# Patient Record
Sex: Male | Born: 1990 | Race: White | Hispanic: No | Marital: Married | Smoking: Never smoker
Health system: Southern US, Community
[De-identification: ages and names within clinical notes are randomized; demographics above are authoritative.]

## PROBLEM LIST (undated history)

## (undated) DIAGNOSIS — G4730 Sleep apnea, unspecified: Secondary | ICD-10-CM

## (undated) DIAGNOSIS — E785 Hyperlipidemia, unspecified: Secondary | ICD-10-CM

## (undated) DIAGNOSIS — F419 Anxiety disorder, unspecified: Secondary | ICD-10-CM

## (undated) DIAGNOSIS — Z973 Presence of spectacles and contact lenses: Secondary | ICD-10-CM

## (undated) HISTORY — PX: ESOPHAGOGASTRODUODENOSCOPY: SHX1529

## (undated) HISTORY — PX: COLONOSCOPY: SHX174

---
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
## 2014-01-12 ENCOUNTER — Emergency Department: Payer: Self-pay | Admitting: Emergency Medicine

## 2014-01-12 ENCOUNTER — Ambulatory Visit: Payer: Self-pay | Admitting: Neurology

## 2014-01-12 LAB — CBC
HCT: 44.8 % (ref 40.0–52.0)
HGB: 14.9 g/dL (ref 13.0–18.0)
MCH: 28.4 pg (ref 26.0–34.0)
MCHC: 33.3 g/dL (ref 32.0–36.0)
MCV: 85 fL (ref 80–100)
Platelet: 203 10*3/uL (ref 150–440)
RBC: 5.25 10*6/uL (ref 4.40–5.90)
RDW: 12.6 % (ref 11.5–14.5)
WBC: 8.3 10*3/uL (ref 3.8–10.6)

## 2014-01-12 LAB — URINALYSIS, COMPLETE
BLOOD: NEGATIVE
Bacteria: NONE SEEN
Bilirubin,UR: NEGATIVE
GLUCOSE, UR: NEGATIVE mg/dL (ref 0–75)
Ketone: NEGATIVE
LEUKOCYTE ESTERASE: NEGATIVE
Nitrite: NEGATIVE
PH: 5 (ref 4.5–8.0)
Protein: NEGATIVE
RBC,UR: 1 /HPF (ref 0–5)
Specific Gravity: 1.018 (ref 1.003–1.030)
Squamous Epithelial: <1
WBC UR: 1 /HPF (ref 0–5)

## 2014-01-12 LAB — DRUG SCREEN, URINE
AMPHETAMINES, UR SCREEN: NEGATIVE (ref ?–1000)
BARBITURATES, UR SCREEN: NEGATIVE (ref ?–200)
BENZODIAZEPINE, UR SCRN: NEGATIVE (ref ?–200)
Cannabinoid 50 Ng, Ur ~~LOC~~: NEGATIVE (ref ?–50)
Cocaine Metabolite,Ur ~~LOC~~: NEGATIVE (ref ?–300)
MDMA (ECSTASY) UR SCREEN: NEGATIVE (ref ?–500)
Methadone, Ur Screen: NEGATIVE (ref ?–300)
OPIATE, UR SCREEN: NEGATIVE (ref ?–300)
Phencyclidine (PCP) Ur S: NEGATIVE (ref ?–25)
Tricyclic, Ur Screen: NEGATIVE (ref ?–1000)

## 2014-01-12 LAB — BASIC METABOLIC PANEL
Anion Gap: 6 — ABNORMAL LOW (ref 7–16)
BUN: 10 mg/dL (ref 7–18)
CALCIUM: 8.9 mg/dL (ref 8.5–10.1)
CHLORIDE: 105 mmol/L (ref 98–107)
CREATININE: 1.05 mg/dL (ref 0.60–1.30)
Co2: 29 mmol/L (ref 21–32)
EGFR (African American): >60
EGFR (Non-African Amer.): >60
Glucose: 98 mg/dL (ref 65–99)
OSMOLALITY: 278 (ref 275–301)
Potassium: 4 mmol/L (ref 3.5–5.1)
SODIUM: 140 mmol/L (ref 136–145)

## 2014-10-29 NOTE — Consult Note (Signed)
PATIENT NAME:  Perry Villanueva, Perry Villanueva MR#:  [id number] DATE OF BIRTH:  1991/01/15  DATE OF CONSULTATION:  01/12/2014  CONSULTING PHYSICIAN:  Pauletta BrownsYuriy Eliel Dudding, MD  REASON FOR CONSULTATION:  Headache.   HISTORY OF PRESENT ILLNESS: This is a pleasant 24 year old gentleman. No significant past medical history besides arachnoid cyst and history of  migraines, presents with a 2-3 day history of right temporal headache. The patient states he was eating, sitting with his girlfriend and then had sudden onset right temporal headache and that headache has spread diffuse, pressure-like 9/10 and worsened by position change.  The patient states he has chronic migraines.  Migraines are self relieved and completely different from these symptoms. No visual deficits at this point. 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    PAST MEDICAL HISTORY: No significant other past medical history.   FAMILY HISTORY: Noncontributory.   SOCIAL HISTORY:  No smoking or EtOH use.  REVIEW OF SYSTEMS:  No shortness of breath, generalized headache. No visual changes. No shortness of breath. No abdominal pain. No diarrhea or constipation. No focal weakness on one side of the body compared to the other.   PHYSICAL EXAMINATION: NEUROLOGICAL: Complains of generalized headache. No double vision. No blurred vision. Pupils equal, round, reactive. Facial sensation intact. Facial motor is intact. Motor 5/5 bilateral upper and lower extremities. Reflexes are intact throughout. Coordination: Finger-to-nose intact. Gait: Unable to assess because of generalized pain.  IMPRESSION:  A 24 year old male with history of left-sided arachnoid cyst, history of migraines 1-2 times a month, presents with sudden onset of right temporal headache that is not diffusely and radiates bilateral frontal lobe and worse with change of position.   PLAN: Since the  headache is sudden onset and not progressive, want to make sure we can rule out subarachnoid hemorrhage. Would recommend CT angiogram of head with contrast to make sure there are no signs of aneurysms, 2 grams of magnesium, Phenergan and evaluate the patient further. If the patient continues to have the headache, will consider lumbar puncture to look for xanthochromia making sure there is no subarachnoid hemorrhage.  Thank you.  It was a pleasure seeing this patient. Please call if questions.  ____________________________ Pauletta BrownsYuriy Kendan Cornforth, MD yz:ds D: 01/12/2014 15:01:27 ET T: 01/12/2014 19:31:08 ET JOB#: [id number]  cc: Pauletta BrownsYuriy Jodelle Fausto, MD, <Dictator> Pauletta BrownsYURIY Emigdio Wildeman MD ELECTRONICALLY SIGNED 02/08/2014 21:10

## 2020-02-21 ENCOUNTER — Emergency Department
Admission: EM | Admit: 2020-02-21 | Discharge: 2020-02-21 | Disposition: A | Discharge status: Home / Self Care | Payer: BC Managed Care – PPO | Attending: Emergency Medicine | Admitting: Emergency Medicine

## 2020-02-21 ENCOUNTER — Emergency Department: Payer: BC Managed Care – PPO

## 2020-02-21 ENCOUNTER — Encounter: Payer: Self-pay | Admitting: Emergency Medicine

## 2020-02-21 ENCOUNTER — Other Ambulatory Visit: Payer: Self-pay

## 2020-02-21 DIAGNOSIS — Z79899 Other long term (current) drug therapy: Secondary | ICD-10-CM | POA: Insufficient documentation

## 2020-02-21 DIAGNOSIS — M545 Low back pain, unspecified: Secondary | ICD-10-CM

## 2020-02-21 MED ORDER — OXYCODONE-ACETAMINOPHEN 5-325 MG PO TABS
1.0000 | ORAL_TABLET | Freq: Once | ORAL | Status: AC
Start: 2020-02-21 — End: 2020-02-21
  Administered 2020-02-21: 1 via ORAL
  Filled 2020-02-21: qty 1

## 2020-02-21 MED ORDER — TRAMADOL HCL 50 MG PO TABS: 50.0000 mg | ORAL_TABLET | Freq: Four times a day (QID) | ORAL | 0 refills | Status: AC | PRN

## 2020-02-21 MED ORDER — IBUPROFEN 400 MG PO TABS
400.0000 mg | ORAL_TABLET | Freq: Four times a day (QID) | ORAL | 0 refills | Status: DC | PRN
Start: 2020-02-21 — End: 2022-06-20

## 2020-02-21 MED ORDER — LIDOCAINE 5 % EX PTCH
1.0000 | MEDICATED_PATCH | CUTANEOUS | Status: DC
  Administered 2020-02-21: 1 via TRANSDERMAL
  Filled 2020-02-21: qty 1

## 2020-02-21 MED ORDER — CYCLOBENZAPRINE HCL 5 MG PO TABS
ORAL_TABLET | ORAL | 0 refills | Status: DC
Start: 2020-02-21 — End: 2022-06-20

## 2020-02-21 MED ORDER — PREDNISONE 20 MG PO TABS
60.0000 mg | ORAL_TABLET | Freq: Once | ORAL | Status: AC
  Administered 2020-02-21: 60 mg via ORAL
  Filled 2020-02-21: qty 3

## 2020-02-21 MED ORDER — LIDOCAINE 5 % EX PTCH: 1.0000 | MEDICATED_PATCH | CUTANEOUS | 0 refills | Status: DC

## 2020-02-21 MED ORDER — KETOROLAC TROMETHAMINE 30 MG/ML IJ SOLN
30.0000 mg | Freq: Once | INTRAMUSCULAR | Status: AC
  Administered 2020-02-21: 30 mg via INTRAMUSCULAR
  Filled 2020-02-21: qty 1

## 2020-02-21 MED ORDER — PREDNISONE 10 MG PO TABS
ORAL_TABLET | ORAL | 0 refills | Status: DC
Start: 2020-02-21 — End: 2022-06-20

## 2020-02-21 MED ORDER — ORPHENADRINE CITRATE 30 MG/ML IJ SOLN
60.0000 mg | Freq: Two times a day (BID) | INTRAMUSCULAR | Status: DC
  Administered 2020-02-21: 60 mg via INTRAMUSCULAR
  Filled 2020-02-21: qty 2

## 2020-02-21 NOTE — ED Provider Notes (Signed)
[facility] Emergency Department Provider Note  ____________________________________________  Time seen: Approximately 7:19 PM  I have reviewed the triage vital signs and the nursing notes.   HISTORY  Chief Complaint Back Pain    HPI Perry Villanueva is a 29 y.o. male that presents to the emergency department for evaluation of low back pain since yesterday.  Patient had just gotten out of the shower, when he felt a pop to his low back.  He took some ibuprofen and went to bed.  This morning, he still had pain throughout his low back.  It did not improve throughout the day and he had difficulty walking due to the pain.  Pain is in his mid low back and does not radiate.  No bowel or bladder dysfunction or saddle anesthesias. No history of back pain.  No numbness, tingling.  History reviewed. No pertinent past medical history.  There are no problems to display for this patient.    Prior to Admission medications   Medication Sig Start Date End Date Taking? Authorizing Provider  escitalopram (LEXAPRO) 20 MG tablet Take 20 mg by mouth daily.   Yes [provider]  cyclobenzaprine (FLEXERIL) 5 MG tablet Take 1-2 tablets 3 times daily as needed 02/21/20   Enid Derry, PA-C  ibuprofen (ADVIL) 400 MG tablet Take 1 tablet (400 mg total) by mouth every 6 (six) hours as needed. 02/21/20   Enid Derry, PA-C  lidocaine (LIDODERM) 5 % Place 1 patch onto the skin daily. Remove & Discard patch within 12 hours or as directed by MD 02/21/20   Enid Derry, PA-C  predniSONE (DELTASONE) 10 MG tablet Take 6 tablets on day 1, take 5 tablets on day 2, take 4 tablets on day 3, take 3 tablets on day 4, take 2 tablets on day 5, take 1 tablet on day 6 02/21/20   Enid Derry, PA-C  traMADol (ULTRAM) 50 MG tablet Take 1 tablet (50 mg total) by mouth every 6 (six) hours as needed. 02/21/20 02/20/21  Enid Derry, PA-C    Allergies Shellfish allergy  No family history on  file.  Social History Social History   Tobacco Use  . Smoking status: Not on file  Substance Use Topics  . Alcohol use: Not on file  . Drug use: Not on file     Review of Systems  Constitutional: No fever/chills Cardiovascular: No chest pain. Respiratory: No SOB. Gastrointestinal: No abdominal pain.  No nausea, no vomiting.  Musculoskeletal: Positive for low back pain. Skin: Negative for rash, abrasions, lacerations, ecchymosis. Neurological: Negative for headaches, numbness or tingling   ____________________________________________   PHYSICAL EXAM:  VITAL SIGNS: ED Triage Vitals  Enc Vitals Group     BP 02/21/20 1843 (!) 148/88     Pulse Rate 02/21/20 1843 88     Resp 02/21/20 1843 18     Temp 02/21/20 1843 98 F (36.7 C)     Temp Source 02/21/20 1843 Oral     SpO2 02/21/20 1843 98 %     Weight 02/21/20 1838 225 lb (102.1 kg)     Height 02/21/20 1838 6' (1.829 m)     Head Circumference --      Peak Flow --      Pain Score 02/21/20 1838 9     Pain Loc --      Pain Edu? --      Excl. in GC? --      Constitutional: Alert and oriented. Well appearing and in  no acute distress. Eyes: Conjunctivae are normal. PERRL. EOMI. Head: Atraumatic. ENT:      Ears:      Nose: No congestion/rhinnorhea.      Mouth/Throat: Mucous membranes are moist.  Neck: No stridor.  Cardiovascular: Normal rate, regular rhythm.  Good peripheral circulation. Respiratory: Normal respiratory effort without tachypnea or retractions. Lungs CTAB. Good air entry to the bases with no decreased or absent breath sounds. Gastrointestinal: Bowel sounds 4 quadrants. Soft and nontender to palpation. No guarding or rigidity. No palpable masses. No distention. Musculoskeletal: Full range of motion to all extremities. No gross deformities appreciated. Mild tenderness to palpation to lumbar spine. Strength equal in lower extremities bilaterally. Neurologic:  Normal speech and language. No gross focal  neurologic deficits are appreciated.  Skin:  Skin is warm, dry and intact. No rash noted. Psychiatric: Mood and affect are normal. Speech and behavior are normal. Patient exhibits appropriate insight and judgement.   ____________________________________________   LABS (all labs ordered are listed, but only abnormal results are displayed)  Labs Reviewed - No data to display ____________________________________________  EKG   ____________________________________________  RADIOLOGY Lexine Baton, personally viewed and evaluated these images (plain radiographs) as part of my medical decision making, as well as reviewing the written report by the radiologist.  DG Lumbar Spine 2-3 Views  Result Date: 02/21/2020 CLINICAL DATA:  Lumbosacral back pain. Felt a pop getting out of the shower. EXAM: LUMBAR SPINE - 2-3 VIEW COMPARISON:  None. FINDINGS: Mild straightening of normal lordosis. Vertebral body heights are normal. There is no listhesis. The posterior elements are intact. Disc spaces are preserved. No fracture. No evidence of pars defects or focal lesion. Sacroiliac joints are symmetric and normal. IMPRESSION: Mild straightening of normal lordosis, can be seen with muscle spasm or positioning. No acute osseous abnormality. Electronically Signed   By: Narda Rutherford M.D.   On: 02/21/2020 19:51    ____________________________________________    PROCEDURES  Procedure(s) performed:    Procedures    Medications  orphenadrine (NORFLEX) injection 60 mg (60 mg Intramuscular Given 02/21/20 1929)  lidocaine (LIDODERM) 5 % 1 patch (1 patch Transdermal Patch Applied 02/21/20 1929)  oxyCODONE-acetaminophen (PERCOCET/ROXICET) 5-325 MG per tablet 1 tablet (1 tablet Oral Given 02/21/20 1929)  ketorolac (TORADOL) 30 MG/ML injection 30 mg (30 mg Intramuscular Given 02/21/20 2125)  predniSONE (DELTASONE) tablet 60 mg (60 mg Oral Given 02/21/20 2214)      ____________________________________________   INITIAL IMPRESSION / ASSESSMENT AND PLAN / ED COURSE  Pertinent labs & imaging results that were available during my care of the patient were reviewed by me and considered in my medical decision making (see chart for details).  Review of the [place] CSRS was performed in accordance of the [facility] prior to dispensing any controlled drugs.    Patient presented to the emergency department for evaluation of low back pain.  Vital signs and exam are reassuring.  No acute bony abnormality on x-ray.  Pain improved following Percocet, Norflex, Toradol.  Patient is now up ambulating.  Patient will be discharged home with prescriptions for Flexeril, Motrin, prednisone, Lidoderm, short course of tramadol. Patient is to follow up with orthopedics as directed.  Referral was given.  Patient is given ED precautions to return to the ED for any worsening or new symptoms.   Perry Villanueva was evaluated in Emergency Department on 02/21/2020 for the symptoms described in the history of present illness. He was evaluated in the context of the global COVID-19 pandemic, which  necessitated consideration that the patient might be at risk for infection with the SARS-CoV-2 virus that causes COVID-19. Institutional protocols and algorithms that pertain to the evaluation of patients at risk for COVID-19 are in a state of rapid change based on information released by regulatory bodies including the CDC and federal and state organizations. These policies and algorithms were followed during the patient's care in the ED.  ____________________________________________  FINAL CLINICAL IMPRESSION(S) / ED DIAGNOSES  Final diagnoses:  Acute midline low back pain without sciatica      NEW MEDICATIONS STARTED DURING THIS VISIT:  ED Discharge Orders         Ordered    predniSONE (DELTASONE) 10 MG tablet     Discontinue  Reprint     02/21/20 2208    cyclobenzaprine (FLEXERIL) 5 MG  tablet     Discontinue  Reprint     02/21/20 2208    lidocaine (LIDODERM) 5 %  Every 24 hours     Discontinue  Reprint     02/21/20 2208    traMADol (ULTRAM) 50 MG tablet  Every 6 hours PRN     Discontinue  Reprint     02/21/20 2208    ibuprofen (ADVIL) 400 MG tablet  Every 6 hours PRN     Discontinue  Reprint     02/21/20 2208              This chart was dictated using voice recognition software/Dragon. Despite best efforts to proofread, errors can occur which can change the meaning. Any change was purely unintentional.    Enid Derry, PA-C 02/21/20 2221    Minna Antis, MD 02/21/20 [phone]

## 2020-02-21 NOTE — ED Notes (Signed)
Pt up to restroom. Will wheel pt out to vehicle once ready.

## 2020-02-21 NOTE — ED Triage Notes (Signed)
Presents with lower back pain  States he felt his back "pop" last pm when he was getting out of shower   Unable to bear full wt  States he used OTC meds w/o relief

## 2020-02-25 ENCOUNTER — Other Ambulatory Visit: Payer: Self-pay | Admitting: Family Medicine

## 2020-02-25 ENCOUNTER — Ambulatory Visit
Admission: RE | Admit: 2020-02-25 | Discharge: 2020-02-25 | Disposition: A | Discharge status: Home / Self Care | Payer: BC Managed Care – PPO | Source: Ambulatory Visit | Attending: Family Medicine | Admitting: Family Medicine

## 2020-02-25 ENCOUNTER — Other Ambulatory Visit: Payer: Self-pay

## 2020-02-25 DIAGNOSIS — M545 Low back pain, unspecified: Secondary | ICD-10-CM

## 2020-07-28 ENCOUNTER — Other Ambulatory Visit: Payer: Self-pay

## 2020-07-28 ENCOUNTER — Ambulatory Visit: Payer: BC Managed Care – PPO | Attending: Nurse Practitioner | Admitting: Physical Therapy

## 2020-07-28 ENCOUNTER — Encounter: Payer: Self-pay | Admitting: Physical Therapy

## 2020-07-28 DIAGNOSIS — G8929 Other chronic pain: Secondary | ICD-10-CM | POA: Diagnosis present

## 2020-07-28 DIAGNOSIS — M6281 Muscle weakness (generalized): Secondary | ICD-10-CM | POA: Diagnosis present

## 2020-07-28 DIAGNOSIS — M545 Low back pain, unspecified: Secondary | ICD-10-CM | POA: Insufficient documentation

## 2020-08-01 NOTE — Therapy (Signed)
[facility] [address]. [place], [place], [address] Phone: [phone]   Fax:  [phone]  Physical Therapy Evaluation  Patient Details  Name: Perry Villanueva MRN: [id number] Date of Birth: 1991/02/20 Referring Provider (PT): Adrian Prows, NP   Encounter Date: 07/28/2020  Treatment: 1 of 1.  Recert date: 07/29/2020 8676 to 1950    [facility] PT Assessment - 08/01/20 0001      Assessment   Medical Diagnosis Lumbar radiculopathy    Referring Provider (PT) Adrian Prows, NP    Onset Date/Surgical Date 02/20/20      Home Environment   Living Environment Private residence      Prior Function   Level of Independence Independent            See FCE report  Pt. Reports increase low back pain on 02/20/20 when stepping out of shower (rotation).  Pt. States the movement resulted in a herniated disc at L5-S1.  Pt. Had a series of cortisone injections with slight improvement.  Pt. Reports difficulty with sleeping due to low back pain.  Pt. C/o 1/10 low back pain currently at rest and 10/10 at worst (R LE "gave out").  Pt. Doing online school and has been out of work at [employer] due to low back issues.               Plan - 08/01/20 0808    Clinical Impression Statement Overall Level of Work: Falls within the Medium range.  Exerting 20 to 50 pounds of force occasionally, and/or 10 to 25 pounds of force frequently, and/or greater than negligible up to 10 pounds of force constantly to move objects.  Physical Demand requirements are in excess of those for Light Work.    Please see the Task Performance Table for specific abilities.  Tolerance for the 8-Hour Day: Based on the individual task scores in Dynamic Strength, Position Tolerance and Mobility, the client is able to tolerate the Medium level of work for the 8-hour day/40-hour week.    Stability/Clinical Decision Making Stable/Uncomplicated    Clinical Decision Making Low    Rehab Potential  Good    PT Frequency One time visit    PT Treatment/Interventions ADLs/Self Care Home Management;Functional mobility training;Gait training;Stair training;Therapeutic exercise;Balance training;Neuromuscular re-education;Patient/family education;Therapeutic activities    PT Next Visit Plan FCE only.  Report faxed to MD office           Patient will benefit from skilled therapeutic intervention in order to improve the following deficits and impairments:  Pain,Postural dysfunction,Decreased strength,Obesity,Decreased range of motion,Decreased activity tolerance,Decreased endurance,Decreased mobility  Visit Diagnosis: Chronic bilateral low back pain, unspecified whether sciatica present  Muscle weakness (generalized)     Problem List There are no problems to display for this patient.  Cammie Mcgee, PT, DPT # [phone] 08/01/2020, 8:15 AM   [facility] [address] [place], [place], [address] Phone: [phone]   Fax:  [phone]  Name: Carlitos Bottino MRN: [id number] Date of Birth: [birth year]

## 2020-10-10 ENCOUNTER — Other Ambulatory Visit: Payer: Self-pay | Admitting: Family Medicine

## 2020-10-10 ENCOUNTER — Ambulatory Visit
Admission: RE | Admit: 2020-10-10 | Discharge: 2020-10-10 | Disposition: A | Discharge status: Home / Self Care | Payer: BC Managed Care – PPO | Source: Ambulatory Visit | Attending: Family Medicine | Admitting: Family Medicine

## 2020-10-10 ENCOUNTER — Other Ambulatory Visit: Payer: Self-pay

## 2020-10-10 DIAGNOSIS — M5416 Radiculopathy, lumbar region: Secondary | ICD-10-CM | POA: Diagnosis present

## 2022-04-11 ENCOUNTER — Other Ambulatory Visit: Payer: Self-pay | Admitting: Podiatry

## 2022-04-11 DIAGNOSIS — M722 Plantar fascial fibromatosis: Secondary | ICD-10-CM

## 2022-04-16 ENCOUNTER — Ambulatory Visit
Admission: RE | Admit: 2022-04-16 | Discharge: 2022-04-16 | Disposition: A | Discharge status: Home / Self Care | Payer: BC Managed Care – PPO | Source: Ambulatory Visit | Attending: Podiatry | Admitting: Podiatry

## 2022-04-16 DIAGNOSIS — M722 Plantar fascial fibromatosis: Secondary | ICD-10-CM | POA: Insufficient documentation

## 2022-06-03 ENCOUNTER — Other Ambulatory Visit: Payer: Self-pay | Admitting: Podiatry

## 2022-06-20 ENCOUNTER — Encounter: Payer: Self-pay | Admitting: Podiatry

## 2022-06-20 NOTE — Discharge Instructions (Addendum)
[facility]  POST OPERATIVE INSTRUCTIONS FOR DR. Vickki Muff AND DR. Bainbridge   Take your medication as prescribed.  Pain medication should be taken only as needed.  May take ibuprofen/tylenol in addition to pain medication as needed.  If pain is still severe try taking ibuprofen/tylenol between pain med doses.  If still severe you may take 1 pain tablet every 4 hours, if still severe you may take 2 pain tablets every 6 hours.  If still severe, contact physician office for instruction.  Pain is a normal response to the healing process and expected during the postop healing time.  Keep the dressing clean, dry and intact.  Maintain nonweightbearing to the left foot at all times.  Try to keep the boot on for the most part at all times other than hygiene/changing clothes.  The boot is acting as a cast/splint, please sleep in the boot with the boot in a slightly elevated position to improve swelling.  Keep your foot elevated above the heart level for the first 48 hours, continue thereafter to improve swelling.  Walking to the bathroom and brief periods of walking are acceptable, unless we have instructed you to be non-weight bearing.  Always wear your post-op shoe when walking.  Always use your crutches if you are to be non-weight bearing.  Do not take a shower. Baths are permissible as long as the foot is kept out of the water.   Every hour you are awake:  Bend your knee 15 times. Massage calf 15 times  Call [facility] [phone]) if any of the following problems occur: You develop a temperature or fever. The bandage becomes saturated with blood. Medication does not stop your pain. Injury of the foot occurs. Any symptoms of infection including redness, odor, or red streaks running from wound.  Information for Discharge Teaching: EXPAREL (bupivacaine liposome injectable suspension)   Your surgeon or  anesthesiologist gave you EXPAREL(bupivacaine) to help control your pain after surgery.  EXPAREL is a local anesthetic that provides pain relief by numbing the tissue around the surgical site. EXPAREL is designed to release pain medication over time and can control pain for up to 72 hours. Depending on how you respond to EXPAREL, you may require less pain medication during your recovery.  Possible side effects: Temporary loss of sensation or ability to move in the area where bupivacaine was injected. Nausea, vomiting, constipation Rarely, numbness and tingling in your mouth or lips, lightheadedness, or anxiety may occur. Call your doctor right away if you think you may be experiencing any of these sensations, or if you have other questions regarding possible side effects.  Follow all other discharge instructions given to you by your surgeon or nurse. Eat a healthy diet and drink plenty of water or other fluids.  If you return to the hospital for any reason within 96 hours following the administration of EXPAREL, it is important for health care providers to know that you have received this anesthetic. A teal colored band has been placed on your arm with the date, time and amount of EXPAREL you have received in order to alert and inform your health care providers. Please leave this armband in place for the full 96 hours following administration, and then you may remove the band.  PERIPHERAL NERVE BLOCK PATIENT INFORMATION  Your surgeon has requested a peripheral nerve block for your surgery. This anesthetic technique provides excellent post-operative pain relief for you in a safe and  effective manner. It will also help reduce the risk of nausea and vomiting and allow earlier discharge from the hospital.   The block is performed under sedation with ultrasound guidance prior to your procedure. Due to the sedation, your may or may not remember the block experience. The nerve block will begin to take  effect anywhere from 5 to 30 minutes after being administered. You will be transported to the operating room from your surgery after the block is completed.   At the end of surgery, when the anesthesia wears off, you will notice a few things. Your may not be able to move or feel the part of your body targeted by the nerve block. These are normal experiences, and they will disappear as the block wears off.  If you had an interscalene nerve block performed (which is common for shoulder surgery), your voice can be very hoarse and you may feel that you are not able to take as deep a breath as you did before surgery. Some patients may also notice a droopy eyelid on the affected side. These symptoms will resolve once the block wears off.  Pain control: The nerve block technique used is a single injection that can last anywhere from 1-3 days. The duration of the numbness can vary between individuals. After leaving the hospital, it is important that you begin to take your prescribed pain medication when you start to sense the nerve block wearing off. This will help you avoid unpleasant pain at the time the nerve block wears off, which can sometimes be in the middle of the night. The block will only cover pain in the areas targeted by the nerve block so if you experience surgical pain outside of that area, please take your prescribed pain medication. Management of the "numb area": After a nerve block, you cannot feel pain, pressure, or temperature in the affected area so there is an increased risk for injury. You should take extra care to protect the affected areas until sensation and movement returns. Please take caution to not come in contact with extremely hot or cold items because you will not be able to sense or protect yourself form the extremes of temperature.  You may experience some persistent numbness after the procedure by most neurological deficits resolve over time and the incidence of serious long term  neurological complications attributable to peripheral nerve blocks are relatively uncommon.

## 2022-06-27 ENCOUNTER — Ambulatory Visit: Payer: BC Managed Care – PPO | Admitting: Anesthesiology

## 2022-06-27 ENCOUNTER — Encounter
Admission: RE | Disposition: A | Discharge status: Home / Self Care | Payer: Self-pay | Source: Home / Self Care | Attending: Podiatry

## 2022-06-27 ENCOUNTER — Ambulatory Visit
Admission: RE | Admit: 2022-06-27 | Discharge: 2022-06-27 | Disposition: A | Discharge status: Home / Self Care | Payer: BC Managed Care – PPO | Attending: Podiatry | Admitting: Podiatry

## 2022-06-27 ENCOUNTER — Encounter: Payer: Self-pay | Admitting: Podiatry

## 2022-06-27 ENCOUNTER — Ambulatory Visit: Payer: Self-pay

## 2022-06-27 ENCOUNTER — Other Ambulatory Visit: Payer: Self-pay

## 2022-06-27 DIAGNOSIS — M62462 Contracture of muscle, left lower leg: Secondary | ICD-10-CM | POA: Insufficient documentation

## 2022-06-27 DIAGNOSIS — F419 Anxiety disorder, unspecified: Secondary | ICD-10-CM | POA: Insufficient documentation

## 2022-06-27 DIAGNOSIS — M722 Plantar fascial fibromatosis: Secondary | ICD-10-CM | POA: Insufficient documentation

## 2022-06-27 DIAGNOSIS — M7732 Calcaneal spur, left foot: Secondary | ICD-10-CM | POA: Insufficient documentation

## 2022-06-27 DIAGNOSIS — G4730 Sleep apnea, unspecified: Secondary | ICD-10-CM | POA: Diagnosis not present

## 2022-06-27 HISTORY — DX: Anxiety disorder, unspecified: F41.9

## 2022-06-27 HISTORY — DX: Sleep apnea, unspecified: G47.30

## 2022-06-27 HISTORY — PX: PLANTAR FASCIA RELEASE: SHX2239

## 2022-06-27 HISTORY — PX: EXCISION PARTIAL PHALANX: SHX6617

## 2022-06-27 HISTORY — PX: GASTROC RECESSION EXTREMITY: SHX6262

## 2022-06-27 HISTORY — DX: Presence of spectacles and contact lenses: Z97.3

## 2022-06-27 HISTORY — DX: Hyperlipidemia, unspecified: E78.5

## 2022-06-27 SURGERY — RECESSION, TENDON, GASTROCNEMIUS
Anesthesia: General | Laterality: Left

## 2022-06-27 MED ORDER — ONDANSETRON HCL 4 MG PO TABS: 4.0000 mg | ORAL_TABLET | Freq: Three times a day (TID) | ORAL | 0 refills | Status: AC | PRN

## 2022-06-27 MED ORDER — ROCURONIUM BROMIDE 100 MG/10ML IV SOLN
INTRAVENOUS | Status: DC | PRN
  Administered 2022-06-27: 30 mg via INTRAVENOUS
  Administered 2022-06-27: 20 mg via INTRAVENOUS

## 2022-06-27 MED ORDER — FENTANYL CITRATE (PF) 100 MCG/2ML IJ SOLN
INTRAMUSCULAR | Status: DC | PRN
  Administered 2022-06-27 (×3): 50 ug via INTRAVENOUS
  Administered 2022-06-27: 100 ug via INTRAVENOUS

## 2022-06-27 MED ORDER — BUPIVACAINE LIPOSOME 1.3 % IJ SUSP
INTRAMUSCULAR | Status: DC | PRN
  Administered 2022-06-27: 10 mL

## 2022-06-27 MED ORDER — SUGAMMADEX SODIUM 200 MG/2ML IV SOLN
INTRAVENOUS | Status: DC | PRN
  Administered 2022-06-27: 600 mg via INTRAVENOUS

## 2022-06-27 MED ORDER — 0.9 % SODIUM CHLORIDE (POUR BTL) OPTIME
TOPICAL | Status: DC | PRN
  Administered 2022-06-27: 1000 mL

## 2022-06-27 MED ORDER — ASPIRIN 81 MG PO TBEC: 81.0000 mg | DELAYED_RELEASE_TABLET | Freq: Two times a day (BID) | ORAL | 0 refills | Status: AC

## 2022-06-27 MED ORDER — FENTANYL CITRATE PF 50 MCG/ML IJ SOSY: 25.0000 ug | PREFILLED_SYRINGE | INTRAMUSCULAR | Status: DC | PRN

## 2022-06-27 MED ORDER — OXYCODONE-ACETAMINOPHEN 5-325 MG PO TABS: 1.0000 | ORAL_TABLET | Freq: Four times a day (QID) | ORAL | 0 refills | Status: AC | PRN

## 2022-06-27 MED ORDER — PROPOFOL 10 MG/ML IV BOLUS
INTRAVENOUS | Status: DC | PRN
  Administered 2022-06-27: 200 mg via INTRAVENOUS
  Administered 2022-06-27: 100 ug/kg/min via INTRAVENOUS

## 2022-06-27 MED ORDER — OXYCODONE HCL 5 MG PO TABS
5.0000 mg | ORAL_TABLET | Freq: Once | ORAL | Status: AC | PRN
  Administered 2022-06-27: 5 mg via ORAL

## 2022-06-27 MED ORDER — MIDAZOLAM HCL 5 MG/5ML IJ SOLN
INTRAMUSCULAR | Status: DC | PRN
  Administered 2022-06-27: 1 mg via INTRAVENOUS

## 2022-06-27 MED ORDER — ONDANSETRON HCL 4 MG/2ML IJ SOLN
INTRAMUSCULAR | Status: DC | PRN
  Administered 2022-06-27: 4 mg via INTRAVENOUS

## 2022-06-27 MED ORDER — ACETAMINOPHEN 10 MG/ML IV SOLN
INTRAVENOUS | Status: DC | PRN
  Administered 2022-06-27: 1000 mg via INTRAVENOUS

## 2022-06-27 MED ORDER — SUGAMMADEX SODIUM 200 MG/2ML IV SOLN: INTRAVENOUS | Status: DC | PRN

## 2022-06-27 MED ORDER — OXYCODONE HCL 5 MG/5ML PO SOLN: 5.0000 mg | Freq: Once | ORAL | Status: AC | PRN

## 2022-06-27 MED ORDER — CEFAZOLIN SODIUM-DEXTROSE 2-4 GM/100ML-% IV SOLN
2.0000 g | INTRAVENOUS | Status: AC
  Administered 2022-06-27: 2 g via INTRAVENOUS

## 2022-06-27 MED ORDER — LIDOCAINE HCL (CARDIAC) PF 100 MG/5ML IV SOSY
PREFILLED_SYRINGE | INTRAVENOUS | Status: DC | PRN
  Administered 2022-06-27: 100 mg via INTRATRACHEAL

## 2022-06-27 MED ORDER — DEXMEDETOMIDINE HCL IN NACL 80 MCG/20ML IV SOLN
INTRAVENOUS | Status: DC | PRN
  Administered 2022-06-27 (×2): 6 ug via BUCCAL

## 2022-06-27 MED ORDER — BUPIVACAINE HCL (PF) 0.5 % IJ SOLN
INTRAMUSCULAR | Status: DC | PRN
  Administered 2022-06-27: 10 mL via PERINEURAL

## 2022-06-27 MED ORDER — BUPIVACAINE LIPOSOME 1.3 % IJ SUSP
INTRAMUSCULAR | Status: DC | PRN
  Administered 2022-06-27: 10 mL via PERINEURAL

## 2022-06-27 MED ORDER — LACTATED RINGERS IV SOLN: INTRAVENOUS | Status: DC

## 2022-06-27 MED ORDER — SUCCINYLCHOLINE CHLORIDE 200 MG/10ML IV SOSY
PREFILLED_SYRINGE | INTRAVENOUS | Status: DC | PRN
  Administered 2022-06-27: 200 mg via INTRAVENOUS

## 2022-06-27 MED ORDER — DEXAMETHASONE SODIUM PHOSPHATE 4 MG/ML IJ SOLN
INTRAMUSCULAR | Status: DC | PRN
  Administered 2022-06-27: 8 mg via INTRAVENOUS

## 2022-06-27 SURGICAL SUPPLY — 42 items
BNDG CMPR 5X4 CHSV STRCH STRL (GAUZE/BANDAGES/DRESSINGS) ×1
BNDG CMPR STD VLCR NS LF 5.8X4 (GAUZE/BANDAGES/DRESSINGS) ×2
BNDG COHESIVE 4X5 TAN STRL LF (GAUZE/BANDAGES/DRESSINGS) ×1 IMPLANT
BNDG ELASTIC 4X5.8 VLCR NS LF (GAUZE/BANDAGES/DRESSINGS) ×2 IMPLANT
BNDG ESMARK 4X12 TAN STRL LF (GAUZE/BANDAGES/DRESSINGS) ×1 IMPLANT
BNDG GAUZE DERMACEA FLUFF 4 (GAUZE/BANDAGES/DRESSINGS) ×1 IMPLANT
BNDG GZE DERMACEA 4 6PLY (GAUZE/BANDAGES/DRESSINGS) ×1
CANISTER SUCT 1200ML W/VALVE (MISCELLANEOUS) ×1 IMPLANT
COVER LIGHT HANDLE UNIVERSAL (MISCELLANEOUS) ×2 IMPLANT
CUFF TOURN SGL QUICK 18X4 (TOURNIQUET CUFF) IMPLANT
CUFF TOURN SGL QUICK 24 (TOURNIQUET CUFF)
CUFF TRNQT CYL 24X4X16.5-23 (TOURNIQUET CUFF) IMPLANT
DURAPREP 26ML APPLICATOR (WOUND CARE) ×1 IMPLANT
ELECT REM PT RETURN 9FT ADLT (ELECTROSURGICAL) ×1
ELECTRODE REM PT RTRN 9FT ADLT (ELECTROSURGICAL) ×1 IMPLANT
GAUZE SPONGE 4X4 12PLY STRL (GAUZE/BANDAGES/DRESSINGS) ×1 IMPLANT
GAUZE XEROFORM 1X8 LF (GAUZE/BANDAGES/DRESSINGS) ×1 IMPLANT
GLOVE BIOGEL PI IND STRL 7.5 (GLOVE) ×1 IMPLANT
GLOVE SURG SS PI 7.0 STRL IVOR (GLOVE) ×1 IMPLANT
GOWN STRL REUS W/ TWL LRG LVL3 (GOWN DISPOSABLE) ×2 IMPLANT
GOWN STRL REUS W/TWL LRG LVL3 (GOWN DISPOSABLE) ×2
IV NS 250ML (IV SOLUTION) ×1
IV NS 250ML BAXH (IV SOLUTION) ×1 IMPLANT
IV NS 500ML (IV SOLUTION)
IV NS 500ML BAXH (IV SOLUTION) IMPLANT
K-WIRE DBL END TROCAR 6X.062 (WIRE) ×1
KIT CARPAL TUNNEL (MISCELLANEOUS) ×2
KIT ESCP INSRT D SLOT CANN KN (MISCELLANEOUS) ×1 IMPLANT
KIT PRC PRB RTRGD 3ANG KNF HND (MISCELLANEOUS) ×1 IMPLANT
KIT TURNOVER KIT A (KITS) ×1 IMPLANT
KWIRE DBL END TROCAR 6X.062 (WIRE) IMPLANT
NS IRRIG 500ML POUR BTL (IV SOLUTION) ×1 IMPLANT
PACK EXTREMITY ARMC (MISCELLANEOUS) ×1 IMPLANT
PADDING CAST BLEND 4X4 NS (MISCELLANEOUS) ×3 IMPLANT
RASP SM TEAR CROSS CUT (RASP) IMPLANT
SPLINT CAST 1 STEP 4X30 (MISCELLANEOUS) ×1 IMPLANT
STOCKINETTE IMPERVIOUS LG (DRAPES) ×1 IMPLANT
SUT ETHILON 3-0 (SUTURE) ×1 IMPLANT
SUT VIC AB 3-0 SH 27 (SUTURE) ×1
SUT VIC AB 3-0 SH 27X BRD (SUTURE) IMPLANT
WAND TENDON TOPAZ 0 ANGL (MISCELLANEOUS) IMPLANT
WAND TOPAZ MICRO DEBRIDER (MISCELLANEOUS) IMPLANT

## 2022-06-27 NOTE — Anesthesia Procedure Notes (Signed)
Anesthesia Regional Block: Popliteal block   Pre-Anesthetic Checklist: , timeout performed,  Correct Patient, Correct Site, Correct Laterality,  Correct Procedure, Correct Position, site marked,  Risks and benefits discussed,  Surgical consent,  Pre-op evaluation,  At surgeon's request and post-op pain management  Laterality: Lower and Left  Prep: chloraprep       Needles:  Injection technique: Single-shot  Needle Type: Echogenic Needle     Needle Length: 9cm  Needle Gauge: 21     Additional Needles:   Procedures:,,,, ultrasound used (permanent image in chart),,    Narrative:  Start time: 06/27/2022 7:20 AM End time: 06/27/2022 7:25 AM Injection made incrementally with aspirations every 5 mL.  Performed by: Personally  Anesthesiologist: Breyer Tejera, Cleda Mccreedy, MD  Additional Notes: Patient consented for risk and benefits of nerve block including but not limited to nerve damage, failed block, bleeding and infection.  Patient voiced understanding.  Functioning IV was confirmed and monitors were applied.  Timeout done prior to procedure and prior to any sedation being given to the patient.  Patient confirmed procedure site prior to any sedation given to the patient.  A 47mm 22ga Stimuplex needle was used. Sterile prep,hand hygiene and sterile gloves were used.  Minimal sedation used for procedure.  No paresthesia endorsed by patient during the procedure.  Negative aspiration and negative test dose prior to incremental administration of local anesthetic. The patient tolerated the procedure well with no immediate complications.

## 2022-06-27 NOTE — Op Note (Signed)
PODIATRY / FOOT AND ANKLE SURGERY OPERATIVE REPORT    SURGEON: Rosetta Posner, DPM  PRE-OPERATIVE DIAGNOSIS:  1.  Chronic left plantar fasciitis with heel spur 2.  Gastroc equinus severe  POST-OPERATIVE DIAGNOSIS: Same  PROCEDURE(S): Left endoscopic plantar fasciotomy with Topaz micro debridement Heel spur resection left calcaneus Gastroc recession left  HEMOSTASIS: Left thigh tourniquet  ANESTHESIA: general  ESTIMATED BLOOD LOSS: 10 cc  FINDING(S): 1.  Severe gastroc equinus 2.  Thickened and scarred plantar fascia with moderate heel spur  PATHOLOGY/SPECIMEN(S): None  INDICATIONS:   Perry Villanueva is a 31 y.o. male who presents with chronic left heel pain plantarly.  Patient has been seen and treated with a series of steroid injections as well as physical therapy, change in shoes, medication management, cam boot/immobilization, orthoses but still continues to have pain and discomfort.  Patient had an MRI taken which showed bone marrow edema within the calcaneus and a thickened plantar fascia with bone spur and marrow edema at the origin point of the plantar fascia with heel spur.  Plantar fascia also appeared to have a interstitial tear.  All treatment options were discussed with the patient both conservative and surgical attempts at correction clean potential risks and complications at this time patient is elected for surgical invention consisting of left endoscopic plantar fasciotomy with Topaz micro debridement, heel spur resection left calcaneus, left gastroc recession.  No guarantees given.  Consent obtained prior to procedure.  DESCRIPTION: After obtaining full informed written consent, the patient was brought back to the operating room and placed supine upon the operating table.  The patient received IV antibiotics prior to induction.  After obtaining adequate anesthesia, the patient was prepped and draped in the standard fashion.  Preoperatively an Exparel popliteal block was  performed by anesthesia.  An Esmarch bandage was used to exsanguinate the left lower extremity and pneumatic thigh tourniquet was inflated.  Attention was then directed to the posterior aspect the left lower leg where a small incision was made distal to the myotendinous junction of the gastroc with the Achilles.  At this time the incision was deepened to the subcutaneous tissues and all vital neurovascular structures were retracted throughout the case.  The sural nerve and saphenous vein were identified and retracted laterally throughout the remainder the case.  At this time an incision was made into the deep fascia and the deep fascia was then retracted medially laterally by exposing the gastroc aponeurosis.  The gastroc a process was then cut from lateral to medial in its entirety and the soleus muscle belly was exposed indicating successful release.  Patient had increased dorsiflexion across the ankle joint to approximately 5 degrees, greatly improved compared to preop.  The surgical site was flushed with copious amounts normal sterile saline.  The deep fascia was then reapproximated with 3-0 Vicryl.  The subcutaneous tissue was reapproximated well coapted with 3-0 Vicryl.  The skin was then reapproximated well coapted with skin staples.  Attention was then directed to the heel where a small 1 cm incision was made slightly distal to the origin point of the plantar fascia on the calcaneus.  This incision was deepened through the subcutaneous tissues with blunt dissection and blunt dissection was continued underneath the plantar fascia to the lateral surface of the heel.  A small percutaneous incision that was made in this area was then opened up with a hemostat and the medial and lateral incisions were connected through the tunnel that was made.  The obturator and cannula  was then placed through this area from medial to lateral.  The cannula was left intact and the obturator was removed.  Cotton tip  applicators were then placed through the cannula and the arthroscope was then introduced through the lateral heel portal.  The plantar fascia was identified with the scope.  At this time the hook blade was then introduced through the medial heel portal under arthroscopic guidance.  Approximately two thirds of the central band and all the medial band of the plantar fascia was resected.  Once resected the plantar musculature was noted, flexor digitorum brevis muscle belly indicating successful release.  The taut medial band that was noted before the procedure appeared to be very loose at this time indicative of successful release.  The instrumentation was removed and the area was flushed with copious amounts normal sterile saline.    At this time the paddle rasp was then introduced through the medial heel portal under fluoroscopic guidance.  This was placed on the plantar tubercle of the calcaneus and the heel spur was resected utilizing this instrumentation.  After this was performed further while under fluoroscopic guidance the heel spur appeared to be removed and the heel appeared to have a normal contour.  The surgical site was flushed with copious amounts normal sterile saline.  The skin incisions were then reapproximated well coapted with 3-0 nylon.  Attention was then directed to the plantar heel where a 5 x 4 grid was made along the medial and central band of the plantar fascia near the plantar heel.  Once this grid was made a 0.062 K wire was used to create pilot holes in the area.  The Topaz microdebrider wand was then placed through each hole in 3 different directions to debride the plantar fascia further.  An additional 10 cc of Exparel was injected about the operative sites at the calf and heel.  The incision sites were then dressed with Xeroform followed by 4 x 4 gauze, ABD, Kerlix, Ace wrap.  A cam boot was applied.  The pneumatic thigh tourniquet was deflated and a prompt hyperemic response was  noted to all digits left foot.  The patient tolerated the procedure and anesthesia well was transferred to recovery room vital signs stable vascular status intact pulses left foot.  Following a period of postoperative monitoring the patient be discharged home with the appropriate orders, instructions, and medications.  Patient is to remain nonweightbearing at all times for the next 2 to 4 weeks.  Will follow-up in clinic next week for further evaluation.  COMPLICATIONS: None  CONDITION: Good, stable  Rosetta Posner, DPM

## 2022-06-27 NOTE — Progress Notes (Signed)
Assisted  Dr. Randa Ngo  with left, popliteal block. Side rails up, monitors on throughout procedure. See vital signs in flow sheet. Tolerated Procedure well.

## 2022-06-27 NOTE — H&P (Signed)
HISTORY AND PHYSICAL INTERVAL NOTE:  06/27/2022  7:18 AM  Perry Villanueva  has presented today for surgery, with the diagnosis of M21.6X2 - Equinus M72.2 - Platar fasciitis M77.32 - Calcaneal Spur.  The various methods of treatment have been discussed with the patient.  No guarantees were given.  After consideration of risks, benefits and other options for treatment, the patient has consented to surgery.  I have reviewed the patients' chart and labs.    PROCEDURE: ALL LEFT FOOT/LEG ENDOSCOPIC PLANTAR FASCIOTOMY TOPAZ MICRODEBRIDEMENT OF PLANTAR FASCIA HEEL SPUR RESECTION GASTROC RECESSION  A history and physical examination was performed in my office.  The patient was reexamined.  There have been no changes to this history and physical examination.  Rosetta Posner, DPM

## 2022-06-27 NOTE — Anesthesia Preprocedure Evaluation (Signed)
Anesthesia Evaluation  Patient identified by MRN, date of birth, ID band Patient awake    Reviewed: Allergy & Precautions, NPO status , Patient's Chart, lab work & pertinent test results  History of Anesthesia Complications Negative for: history of anesthetic complications  Airway Mallampati: III  TM Distance: <3 FB Neck ROM: full    Dental  (+) Chipped   Pulmonary neg shortness of breath, sleep apnea and Continuous Positive Airway Pressure Ventilation    Pulmonary exam normal        Cardiovascular Exercise Tolerance: Good (-) angina (-) Past MI and (-) DOE negative cardio ROS Normal cardiovascular exam     Neuro/Psych   Anxiety     negative neurological ROS     GI/Hepatic negative GI ROS, Neg liver ROS,neg GERD  ,,  Endo/Other  negative endocrine ROS    Renal/GU      Musculoskeletal   Abdominal   Peds  Hematology negative hematology ROS (+)   Anesthesia Other Findings Past Medical History: No date: Anxiety No date: Hyperlipidemia No date: Sleep apnea     Comment:  CPAP No date: Wears contact lenses     Comment:  sometimes  Past Surgical History: No date: COLONOSCOPY No date: ESOPHAGOGASTRODUODENOSCOPY  BMI    Body Mass Index: 35.84 kg/m      Reproductive/Obstetrics negative OB ROS                             Anesthesia Physical Anesthesia Plan  ASA: 3  Anesthesia Plan: General LMA   Post-op Pain Management: Regional block   Induction: Intravenous  PONV Risk Score and Plan: Dexamethasone, Ondansetron, Midazolam and Treatment may vary due to age or medical condition  Airway Management Planned: LMA  Additional Equipment:   Intra-op Plan:   Post-operative Plan: Extubation in OR  Informed Consent: I have reviewed the patients History and Physical, chart, labs and discussed the procedure including the risks, benefits and alternatives for the proposed anesthesia  with the patient or authorized representative who has indicated his/her understanding and acceptance.     Dental Advisory Given  Plan Discussed with: Anesthesiologist, CRNA and Surgeon  Anesthesia Plan Comments: (Patient consented for risks of anesthesia including but not limited to:  - adverse reactions to medications - damage to eyes, teeth, lips or other oral mucosa - nerve damage due to positioning  - sore throat or hoarseness - Damage to heart, brain, nerves, lungs, other parts of body or loss of life  Patient voiced understanding.)       Anesthesia Quick Evaluation

## 2022-06-27 NOTE — Transfer of Care (Signed)
Immediate Anesthesia Transfer of Care Note  Patient: Perry Villanueva  Procedure(s) Performed: GASTROC RECESSION EXTREMITY (Left) PLANTAR FASCIA RELEASE (Left) PARTIAL CALCANECTOMY (Left)  Patient Location: PACU  Anesthesia Type: General LMA  Level of Consciousness: awake, alert  and patient cooperative  Airway and Oxygen Therapy: Patient Spontanous Breathing and Patient connected to supplemental oxygen  Post-op Assessment: Post-op Vital signs reviewed, Patient's Cardiovascular Status Stable, Respiratory Function Stable, Patent Airway and No signs of Nausea or vomiting  Post-op Vital Signs: Reviewed and stable  Complications: No notable events documented. Converted to ETT

## 2022-06-27 NOTE — Anesthesia Postprocedure Evaluation (Signed)
Anesthesia Post Note  Patient: Perry Villanueva  Procedure(s) Performed: GASTROC RECESSION EXTREMITY (Left) PLANTAR FASCIA RELEASE (Left) PARTIAL CALCANECTOMY (Left)  Patient location during evaluation: PACU Anesthesia Type: General Level of consciousness: awake and alert Pain management: pain level controlled Vital Signs Assessment: post-procedure vital signs reviewed and stable Respiratory status: spontaneous breathing, nonlabored ventilation, respiratory function stable and patient connected to nasal cannula oxygen Cardiovascular status: blood pressure returned to baseline and stable Postop Assessment: no apparent nausea or vomiting Anesthetic complications: no   No notable events documented.   Last Vitals:  Vitals:   06/27/22 1000 06/27/22 1015  BP: 125/71 111/67  Pulse: 86 88  Resp: 17 11  Temp:    SpO2: 97% 94%    Last Pain:  Vitals:   06/27/22 1015  PainSc: 6                  Perry Villanueva

## 2022-06-28 ENCOUNTER — Encounter: Payer: Self-pay | Admitting: Podiatry

## 2023-04-04 IMAGING — MR MR LUMBAR SPINE W/O CM
5 series · 31 of 48 positions shown · non-contrast
Comparison: 02/25/2020

CLINICAL DATA: Low back pain with bilateral leg pain and numbness.
Lifting injury yesterday.

EXAM:
MRI LUMBAR SPINE WITHOUT CONTRAST
TECHNIQUE: Multiplanar, multisequence MR imaging of the lumbar spine was
performed. No intravenous contrast was administered.

[Series 5: T2 · sagittal · 4.0mm · 0.81mm/px · 6 of 17 slices shown (1 of 2)]
[im 1/17]
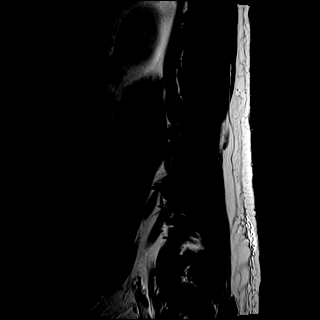
[im 4/17]
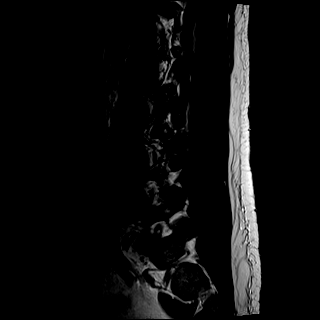
[im 7/17]
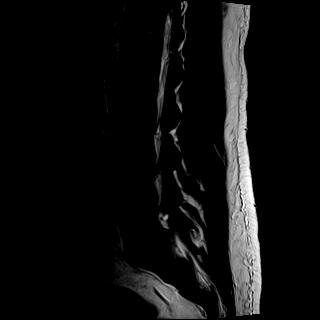
[im 10/17]
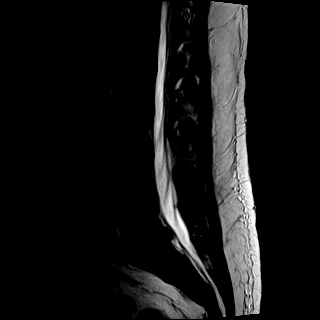
[im 13/17]
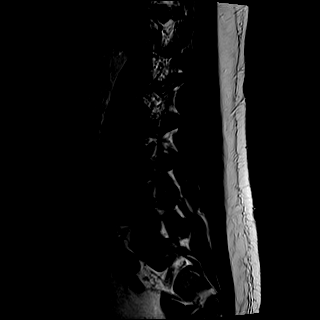
[im 17/17]
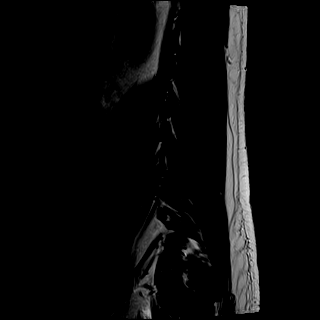

[Series 6: T1 · sagittal · 4.0mm · 0.81mm/px · 7 of 17 slices shown (1 of 2)]
[im 1/17]
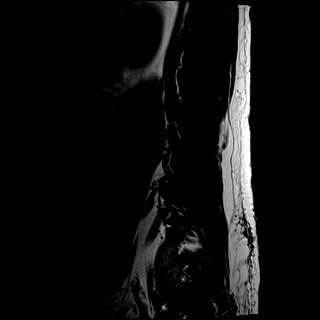
[im 3/17]
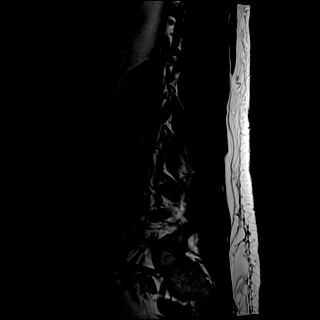
[im 6/17]
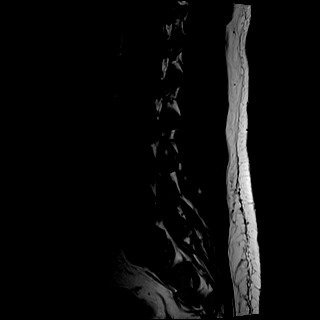
[im 9/17]
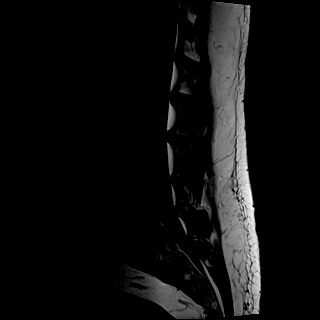
[im 11/17]
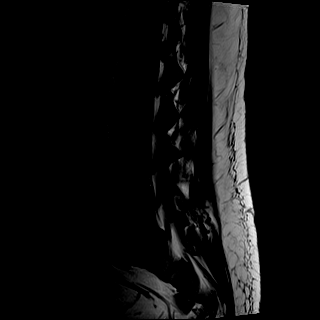
[im 14/17]
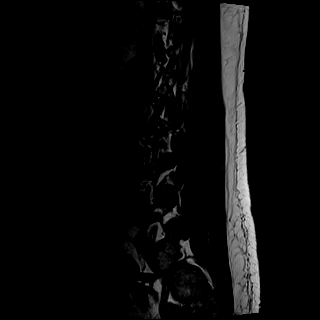
[im 17/17]
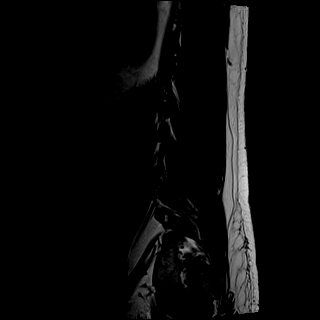

[Series 7: STIR · sagittal · 4.0mm · 0.41mm/px · 2 of 17 slices shown]
[im 1/17]
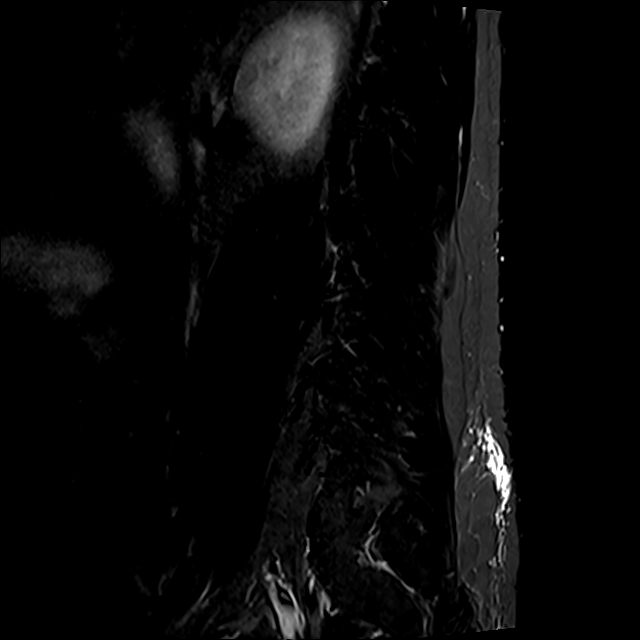
[im 3/17]
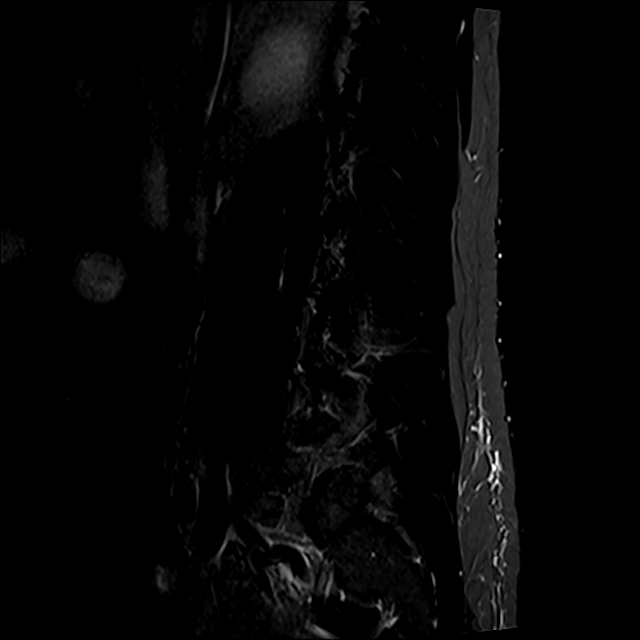

[Series 8: T2 · axial · 4.0mm · 0.78mm/px · z∈[-32,+175]mm · 8 of 37 slices shown (2 of 2)]
[im 1/37]
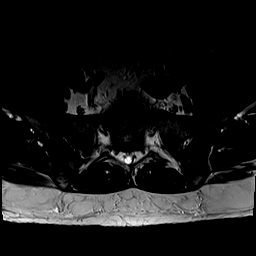
[im 6/37]
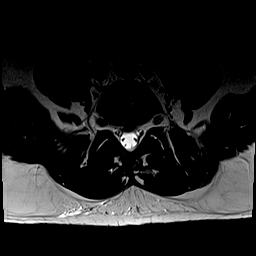
[im 12/37]
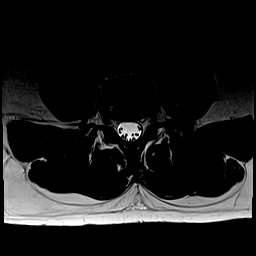
[im 17/37]
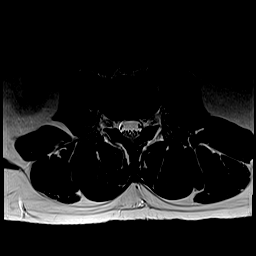
[im 20/37]
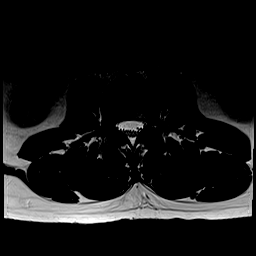
[im 25/37]
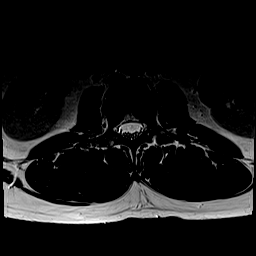
[im 31/37]
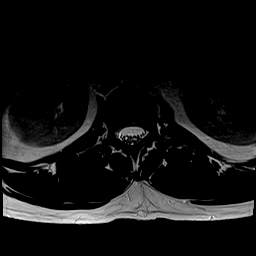
[im 37/37]
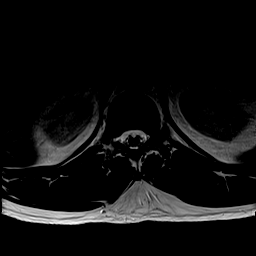

[Series 9: T1 · axial · 4.0mm · 0.39mm/px · z∈[-32,+175]mm · 8 of 37 slices shown (2 of 2)]
[im 1/37]
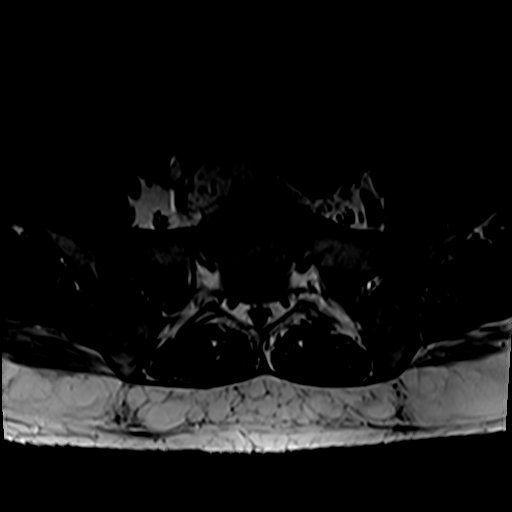
[im 6/37]
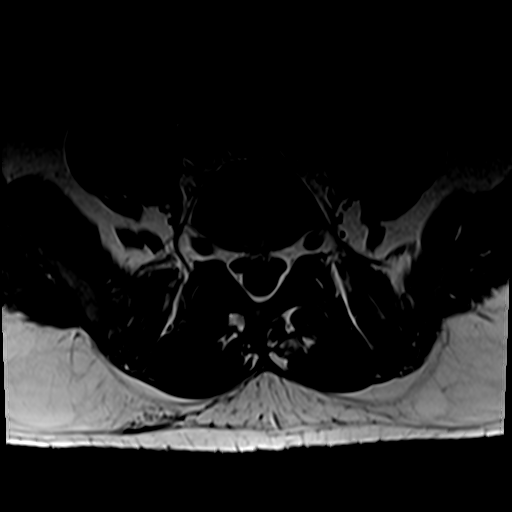
[im 12/37]
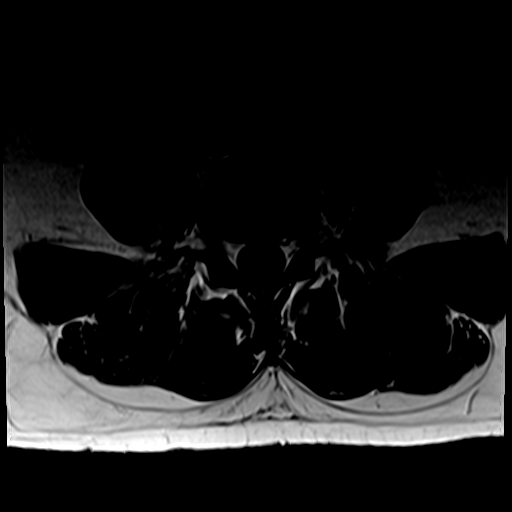
[im 17/37]
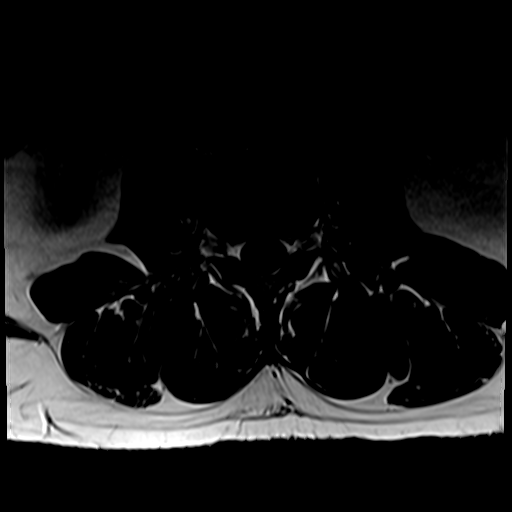
[im 20/37]
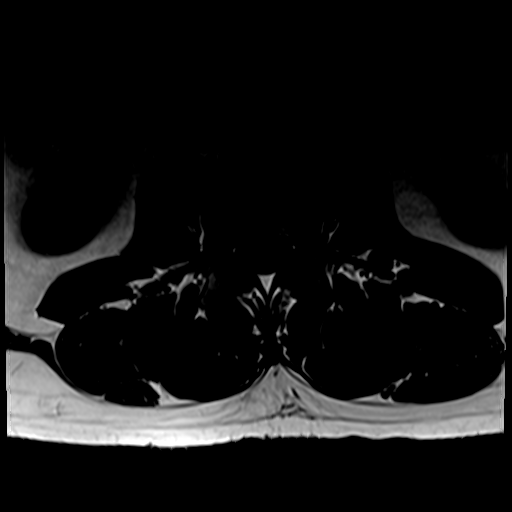
[im 25/37]
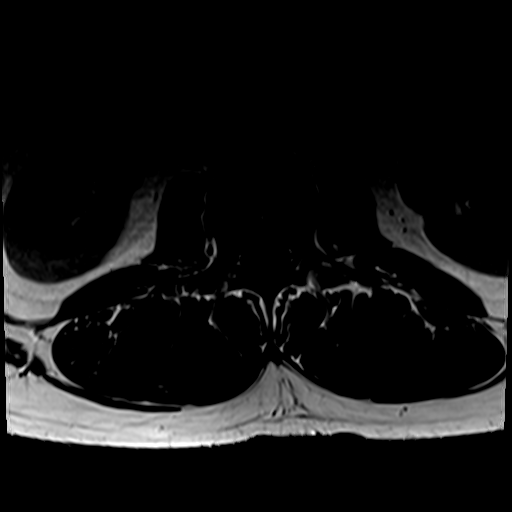
[im 31/37]
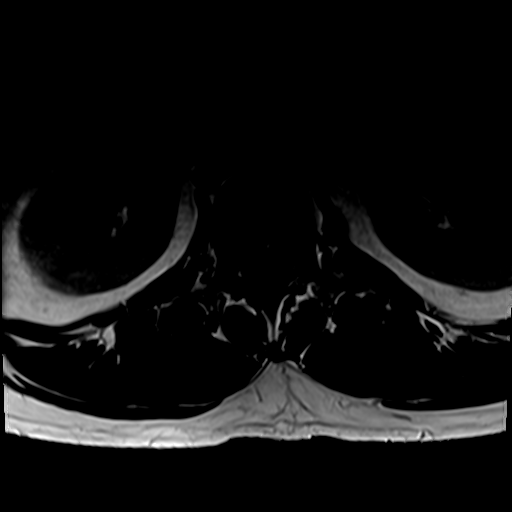
[im 37/37]
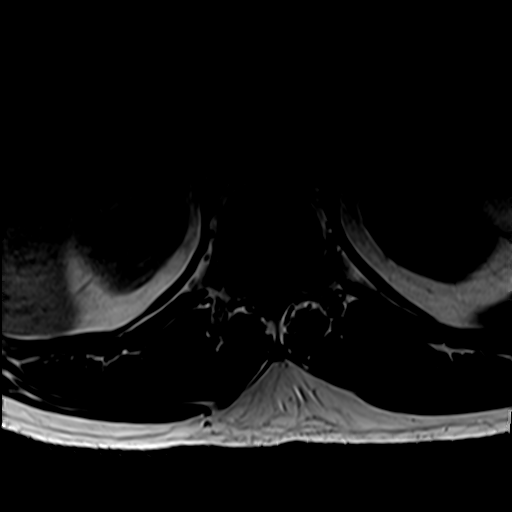

[31 of 48 positions shown; findings below may reference images not displayed]

FINDINGS: Segmentation:  5 lumbar type vertebral bodies.

Alignment:  Normal

Vertebrae:  Normal

Conus medullaris and cauda equina: Conus extends to the L1 level.
Conus and cauda equina appear normal.

Paraspinal and other soft tissues: Normal

Disc levels:

No disc abnormality at L4-5 or above. There is minimal facet
hypertrophy at L4-5 but no compressive stenosis.

L5-S1: Disc degeneration with a central disc herniation. This was
present in [REDACTED] of last year and appears quite similar. No
evidence of worsening. Disc material contacts the ventral thecal sac
and is adjacent to both S1 root sleeves, but clear neural
compression is not demonstrated.
IMPRESSION: 1. L5-S1 disc degeneration with a central disc herniation. This was
present in [REDACTED] of last year and appears quite similar. Disc
material contacts the ventral thecal sac and is adjacent to both S1
root sleeves, but clear neural compression is not demonstrated. No
evidence of acute worsening.
